# Patient Record
Sex: Male | Born: 1945 | Race: White | Hispanic: No | Marital: Married | Smoking: Former smoker
Health system: Southern US, Community
[De-identification: ages and names within clinical notes are randomized; demographics above are authoritative.]

## PROBLEM LIST (undated history)

## (undated) DIAGNOSIS — I2510 Atherosclerotic heart disease of native coronary artery without angina pectoris: Secondary | ICD-10-CM

## (undated) DIAGNOSIS — M353 Polymyalgia rheumatica: Secondary | ICD-10-CM

## (undated) DIAGNOSIS — O2230 Deep phlebothrombosis in pregnancy, unspecified trimester: Secondary | ICD-10-CM

---

## 2019-09-25 ENCOUNTER — Ambulatory Visit: Payer: Self-pay

## 2019-10-03 ENCOUNTER — Ambulatory Visit: Payer: Self-pay

## 2019-10-16 ENCOUNTER — Ambulatory Visit: Payer: Self-pay

## 2020-07-19 ENCOUNTER — Other Ambulatory Visit: Payer: Self-pay

## 2020-07-19 ENCOUNTER — Encounter (HOSPITAL_BASED_OUTPATIENT_CLINIC_OR_DEPARTMENT_OTHER): Payer: Self-pay

## 2020-07-19 ENCOUNTER — Emergency Department (HOSPITAL_BASED_OUTPATIENT_CLINIC_OR_DEPARTMENT_OTHER)
Admission: EM | Admit: 2020-07-19 | Discharge: 2020-07-19 | Disposition: A | Discharge status: Home / Self Care | Payer: 59 | Attending: Emergency Medicine | Admitting: Emergency Medicine

## 2020-07-19 ENCOUNTER — Emergency Department (HOSPITAL_BASED_OUTPATIENT_CLINIC_OR_DEPARTMENT_OTHER): Payer: 59

## 2020-07-19 DIAGNOSIS — I82401 Acute embolism and thrombosis of unspecified deep veins of right lower extremity: Secondary | ICD-10-CM | POA: Insufficient documentation

## 2020-07-19 DIAGNOSIS — Z87891 Personal history of nicotine dependence: Secondary | ICD-10-CM | POA: Diagnosis not present

## 2020-07-19 DIAGNOSIS — M79661 Pain in right lower leg: Secondary | ICD-10-CM | POA: Diagnosis present

## 2020-07-19 DIAGNOSIS — Z7901 Long term (current) use of anticoagulants: Secondary | ICD-10-CM | POA: Insufficient documentation

## 2020-07-19 DIAGNOSIS — I82451 Acute embolism and thrombosis of right peroneal vein: Secondary | ICD-10-CM

## 2020-07-19 HISTORY — DX: Polymyalgia rheumatica: M35.3

## 2020-07-19 MED ORDER — APIXABAN (ELIQUIS) VTE STARTER PACK (10MG AND 5MG): ORAL_TABLET | ORAL | 0 refills | Status: AC

## 2020-07-19 NOTE — ED Provider Notes (Signed)
[facility] EMERGENCY DEPARTMENT Provider Note   CSN: [id number] Arrival date & time: 07/19/20  0254     History Chief Complaint  Patient presents with  . Leg Pain    Cesar Webb is a 74 y.o. male.  HPI Pain and swelling in the right lower leg over the last 2 days.  Woke with it Saturday morning.  States leg is more swollen.  No fevers.  He is on Effient for previous heart stents.  No trauma.  States there is more bruising.  States it hurts to walk on.  Hurts a lot to touch just the back of his calf.  Think something could have bit him but not see anything.  He is also on prednisone for polymyalgia rheumatica has not had clots in his leg previously.  No chest pain or trouble breathing.  States his heart rate was 60 and sats of 97 at home.    Past Medical History:  Diagnosis Date  . PMR (polymyalgia rheumatica) (HCC)     There are no problems to display for this patient.   History reviewed. No pertinent surgical history.     History reviewed. No pertinent family history.  Social History   Tobacco Use  . Smoking status: Former Smoker  Substance Use Topics  . Alcohol use: Yes    Alcohol/week: 7.0 standard drinks    Types: 7 Shots of liquor per week  . Drug use: Never    Home Medications Prior to Admission medications   Medication Sig Start Date End Date Taking? Authorizing Provider  prasugrel (EFFIENT) 10 MG TABS tablet Take 10 mg by mouth daily.   Yes [provider]  predniSONE (DELTASONE) 1 MG tablet Take 3 mg by mouth daily with breakfast.   Yes [provider]  APIXABAN (ELIQUIS) VTE STARTER PACK (10MG  AND 5MG ) Take as directed on package: start with two-5mg  tablets twice daily for 7 days. On day 8, switch to one-5mg  tablet twice daily. 07/19/20   , MD    Allergies    Patient has no known allergies.  Review of Systems   Review of Systems  Constitutional: Negative for appetite change.  HENT: Negative for  congestion.   Respiratory: Negative for shortness of breath.   Cardiovascular: Positive for leg swelling. Negative for palpitations.  Gastrointestinal: Negative for abdominal distention.  Genitourinary: Negative for flank pain.  Musculoskeletal: Negative for back pain.  Skin: Positive for wound.  Neurological: Negative for weakness.  Hematological: Bruises/bleeds easily.  Psychiatric/Behavioral: Negative for confusion.    Physical Exam Updated Vital Signs BP 139/79 (BP Location: Right Arm)   Pulse (!) 58   Temp 98.1 F (36.7 C) (Oral)   Resp 16   Ht 6' (1.829 m)   Wt 88.5 kg   SpO2 100%   BMI 26.45 kg/m   Physical Exam Vitals and nursing note reviewed.  HENT:     Head: Atraumatic.  Pulmonary:     Breath sounds: No wheezing or rhonchi.  Abdominal:     Tenderness: There is no abdominal tenderness.  Musculoskeletal:        General: Tenderness present.     Cervical back: Neck supple.     Comments: Ecchymosis some erythema and edema to right lower leg.  Tenderness worse posteriorly.  Good active and passive flexion and extension at the ankle.  Pain with passive dorsiflexion at the ankle but not as much with active plantarflexion.  Dorsalis pedis pulse intact.  2 scabs on the  anterior mid tibia.  Skin:    General: Skin is warm.     Capillary Refill: Capillary refill takes less than 2 seconds.  Neurological:     Mental Status: He is alert and oriented to person, place, and time.         ED Results / Procedures / Treatments   Labs (all labs ordered are listed, but only abnormal results are displayed) Labs Reviewed - No data to display  EKG None  Radiology DG Tibia/Fibula Right  Result Date: 07/19/2020 CLINICAL DATA:  Spider bite potentially, RIGHT lower leg redness and swelling edema EXAM: RIGHT TIBIA AND FIBULA - 2 VIEW COMPARISON:  November 22, 2017 FINDINGS: Lower extremity edema. Soft tissues otherwise unremarkable with degenerative changes about the RIGHT ankle.  No acute fracture or dislocation. Vascular calcification posterior to the knee. A IMPRESSION: No acute fracture or suspicious bone finding. Soft tissue swelling about the RIGHT lower extremity, generalized without radiopaque foreign body. Electronically Signed   By: Donzetta Kohut M.D.   On: 07/19/2020 09:05   US Venous Img Lower Unilateral Right  Result Date: 07/19/2020 CLINICAL DATA:  74 year old male with right lower extremity edema. EXAM: RIGHT LOWER EXTREMITY VENOUS DOPPLER ULTRASOUND TECHNIQUE: Gray-scale sonography with graded compression, as well as color Doppler and duplex ultrasound were performed to evaluate the right lower extremity deep venous systems from the level of the common femoral vein and including the common femoral, femoral, profunda femoral, popliteal and calf veins including the posterior tibial, peroneal and gastrocnemius veins when visible. Spectral Doppler was utilized to evaluate flow at rest and with distal augmentation maneuvers in the common femoral, femoral and popliteal veins. The contralateral common femoral vein was also evaluated for comparison. COMPARISON:  None. FINDINGS: RIGHT LOWER EXTREMITY Common Femoral Vein: No evidence of thrombus. Normal compressibility, respiratory phasicity and response to augmentation. Central Greater Saphenous Vein: No evidence of thrombus. Normal compressibility and flow on color Doppler imaging. Central Profunda Femoral Vein: No evidence of thrombus. Normal compressibility and flow on color Doppler imaging. Femoral Vein: No evidence of thrombus. Normal compressibility, respiratory phasicity and response to augmentation. Popliteal Vein: No evidence of thrombus. Normal compressibility, respiratory phasicity and response to augmentation. Calf Veins: Acute appearing, noncompressible, mildly expansile and hypoechoic thrombus in the mid peroneal vein which appears occlusive. Posterior tibial vein is patent. Other Findings:  None. LEFT LOWER  EXTREMITY Common Femoral Vein: No evidence of thrombus. Normal compressibility, respiratory phasicity and response to augmentation. IMPRESSION: Acute, occlusive deep vein thrombosis limited to the mid right peroneal vein. These results will be called to the ordering clinician or representative by the Radiologist Assistant, and communication documented in the PACS or [employer]. Marliss Coots, MD [facility] Radiology Electronically Signed   By: Marliss Coots MD   On: 07/19/2020 08:53    Procedures Procedures (including critical care time)  Medications Ordered in ED Medications - No data to display  ED Course  I have reviewed the triage vital signs and the nursing notes.  Pertinent labs & imaging results that were available during my care of the patient were reviewed by me and considered in my medical decision making (see chart for details).    MDM Rules/Calculators/A&P                          Patient presents with pain and swelling in right lower leg.  Does not appear to have cellulitis.  However does have  peroneal DVT.  Symptomatic with redness and swelling.  Discussed with vascular surgery and pharmacy.  Will do anticoagulation since it is symptomatic.  Will follow up with cardiology and has an appointment already on December 2.  Will return for signs of pulmonary embolism.  Discharge home. Final Clinical Impression(s) / ED Diagnoses Final diagnoses:  Acute deep vein thrombosis (DVT) of right peroneal vein (HCC)    Rx / DC Orders ED Discharge Orders         Ordered    APIXABAN (ELIQUIS) VTE STARTER PACK (10MG  AND 5MG )        07/19/20 0946           , MD 07/19/20 1001

## 2020-07-19 NOTE — Discharge Instructions (Addendum)
Take the Eliquis along with your other heart medicines.  Watch for signs of bleeding and be very cautious if you hit your head or have other injuries.  Follow-up with your cardiologist as planned follow-up with your PCP or the vascular surgeon for further evaluation and treatment of the DVT.

## 2020-07-19 NOTE — ED Triage Notes (Addendum)
Pt reports pain and redness in R lower leg and thinks it was an insect bite. Pt states he thinks it happened Friday night while he was sleeping. PT c/o pain, redness and swelling. Bruising, redness, warmth and breaks in skin noted on R lower leg.

## 2020-08-14 ENCOUNTER — Other Ambulatory Visit: Payer: Self-pay

## 2020-08-14 ENCOUNTER — Encounter (HOSPITAL_BASED_OUTPATIENT_CLINIC_OR_DEPARTMENT_OTHER): Payer: Self-pay | Admitting: Emergency Medicine

## 2020-08-14 ENCOUNTER — Emergency Department (HOSPITAL_BASED_OUTPATIENT_CLINIC_OR_DEPARTMENT_OTHER)
Admission: EM | Admit: 2020-08-14 | Discharge: 2020-08-14 | Disposition: A | Discharge status: Home / Self Care | Payer: 59 | Attending: Emergency Medicine | Admitting: Emergency Medicine

## 2020-08-14 DIAGNOSIS — I2510 Atherosclerotic heart disease of native coronary artery without angina pectoris: Secondary | ICD-10-CM | POA: Insufficient documentation

## 2020-08-14 DIAGNOSIS — Z87891 Personal history of nicotine dependence: Secondary | ICD-10-CM | POA: Insufficient documentation

## 2020-08-14 DIAGNOSIS — H1132 Conjunctival hemorrhage, left eye: Secondary | ICD-10-CM | POA: Diagnosis not present

## 2020-08-14 DIAGNOSIS — H5789 Other specified disorders of eye and adnexa: Secondary | ICD-10-CM | POA: Diagnosis present

## 2020-08-14 DIAGNOSIS — Z7901 Long term (current) use of anticoagulants: Secondary | ICD-10-CM | POA: Diagnosis not present

## 2020-08-14 HISTORY — DX: Deep phlebothrombosis in pregnancy, unspecified trimester: O22.30

## 2020-08-14 HISTORY — DX: Atherosclerotic heart disease of native coronary artery without angina pectoris: I25.10

## 2020-08-14 NOTE — Discharge Instructions (Signed)
See your eye doctor for recheck next week

## 2020-08-14 NOTE — ED Triage Notes (Signed)
Swelling and bleeding noted to L eye. This started last night after taking his contacts out and blowing his nose. Pt takes Eliquis. States it feels like there is pressure in his eye and he has a "curtain" over it.

## 2020-08-14 NOTE — ED Provider Notes (Signed)
[facility] EMERGENCY DEPARTMENT Provider Note   CSN: [id number] Arrival date & time: 08/14/20  0915     History Chief Complaint  Patient presents with  . Eye Problem    Cesar Webb is a 74 y.o. male.  Pt complains of redness to eye.  Pt reports he sneezed and had bleeding.  Pt reports he has had happen in the past but not this bad.  Pt was started on eliquis recently.  Pt is on the once a day dosage.  Pt reports he has no vision changes.  Pt reports he has had some problems with bleeding from his nose and in to sinuses since starting eliquis   The history is provided by the patient. No language interpreter was used.  Eye Problem Location:  Left eye Quality:  Stinging Severity:  Moderate Onset quality:  Sudden Duration:  1 day Timing:  Constant Progression:  Worsening Chronicity:  New Ineffective treatments:  None tried Associated symptoms: no blurred vision and no decreased vision   Risk factors: conjunctival hemorrhage        Past Medical History:  Diagnosis Date  . Coronary artery disease   . DVT complicating pregnancy   . PMR (polymyalgia rheumatica) (HCC)     There are no problems to display for this patient.   History reviewed. No pertinent surgical history.     No family history on file.  Social History   Tobacco Use  . Smoking status: Former Games developer  . Smokeless tobacco: Never Used  Substance Use Topics  . Alcohol use: Yes    Alcohol/week: 7.0 standard drinks    Types: 7 Shots of liquor per week  . Drug use: Never    Home Medications Prior to Admission medications   Medication Sig Start Date End Date Taking? Authorizing Provider  APIXABAN (ELIQUIS) VTE STARTER PACK (10MG  AND 5MG ) Take as directed on package: start with two-5mg  tablets twice daily for 7 days. On day 8, switch to one-5mg  tablet twice daily. 07/19/20   , MD  prasugrel (EFFIENT) 10 MG TABS tablet Take 10 mg by mouth daily.    [provider]  predniSONE (DELTASONE) 1 MG tablet Take 3 mg by mouth daily with breakfast.    [provider]    Allergies    Patient has no known allergies.  Review of Systems   Review of Systems  Eyes: Negative for blurred vision.  All other systems reviewed and are negative.   Physical Exam Updated Vital Signs BP 137/79 (BP Location: Left Arm)   Pulse 65   Temp 99.3 F (37.4 C) (Oral)   Resp 16   Ht 6' (1.829 m)   Wt 88.5 kg   SpO2 98%   BMI 26.45 kg/m   Physical Exam Vitals and nursing note reviewed.  Constitutional:      Appearance: He is well-developed and well-nourished.  HENT:     Head: Normocephalic and atraumatic.  Eyes:     Extraocular Movements: Extraocular movements intact.     Pupils: Pupils are equal, round, and reactive to light.     Comments: conjunctival hemorrhage and corner of eyes.   Cardiovascular:     Rate and Rhythm: Normal rate and regular rhythm.     Heart sounds: No murmur heard.   Pulmonary:     Effort: Pulmonary effort is normal. No respiratory distress.     Breath sounds: Normal breath sounds.  Abdominal:     Palpations: Abdomen is soft.  Tenderness: There is no abdominal tenderness.  Musculoskeletal:        General: No edema.     Cervical back: Neck supple.  Skin:    General: Skin is warm and dry.  Neurological:     General: No focal deficit present.     Mental Status: He is alert.  Psychiatric:        Mood and Affect: Mood and affect and mood normal.     ED Results / Procedures / Treatments   Labs (all labs ordered are listed, but only abnormal results are displayed) Labs Reviewed - No data to display  EKG None  Radiology No results found.  Procedures Procedures (including critical care time)  Medications Ordered in ED Medications - No data to display  ED Course  I have reviewed the triage vital signs and the nursing notes.  Pertinent labs & imaging results that were available during my care  of the patient were reviewed by me and considered in my medical decision making (see chart for details).    MDM Rules/Calculators/A&P                          MDM:  Pt advised to follow up with his eye doctor for recheck next week.  Final Clinical Impression(s) / ED Diagnoses Final diagnoses:  Subconjunctival bleed, left    Rx / DC Orders ED Discharge Orders    None    An After Visit Summary was printed and given to the patient.    Elson Areas, [place] 08/14/20 1027    Mesner, Barbara Cower, MD 08/14/20 1210

## 2022-02-11 ENCOUNTER — Encounter (HOSPITAL_BASED_OUTPATIENT_CLINIC_OR_DEPARTMENT_OTHER): Payer: Self-pay | Admitting: Emergency Medicine

## 2022-02-11 ENCOUNTER — Emergency Department (HOSPITAL_BASED_OUTPATIENT_CLINIC_OR_DEPARTMENT_OTHER)
Admission: EM | Admit: 2022-02-11 | Discharge: 2022-02-11 | Disposition: A | Discharge status: Home / Self Care | Payer: Medicare Other | Attending: Emergency Medicine | Admitting: Emergency Medicine

## 2022-02-11 ENCOUNTER — Other Ambulatory Visit: Payer: Self-pay

## 2022-02-11 DIAGNOSIS — L089 Local infection of the skin and subcutaneous tissue, unspecified: Secondary | ICD-10-CM | POA: Insufficient documentation

## 2022-02-11 DIAGNOSIS — Z7901 Long term (current) use of anticoagulants: Secondary | ICD-10-CM | POA: Diagnosis not present

## 2022-02-11 DIAGNOSIS — S81812A Laceration without foreign body, left lower leg, initial encounter: Secondary | ICD-10-CM | POA: Diagnosis not present

## 2022-02-11 DIAGNOSIS — S8992XA Unspecified injury of left lower leg, initial encounter: Secondary | ICD-10-CM | POA: Diagnosis present

## 2022-02-11 DIAGNOSIS — X58XXXA Exposure to other specified factors, initial encounter: Secondary | ICD-10-CM | POA: Diagnosis not present

## 2022-02-11 MED ORDER — DOXYCYCLINE HYCLATE 100 MG PO CAPS: 100.0000 mg | ORAL_CAPSULE | Freq: Two times a day (BID) | ORAL | 0 refills | Status: AC

## 2022-02-11 NOTE — ED Provider Notes (Signed)
[facility] EMERGENCY DEPARTMENT Provider Note   CSN: [id number] Arrival date & time: 02/11/22  1308     History  Chief Complaint  Patient presents with   Extremity Laceration    Left Lower Leg    Cesar Webb is a 76 y.o. male who presents to the ED for evaluation of a left lower extremity skin tear that occurred last week in which he is concerned of developing infection.  Patient states there is a metal section sticking out from his nightstand which is why he accidentally lacerated his skin with 1 walking past.  He notes that he is on Eliquis and long-term, daily steroid use for PMR.  He notes that he is prone to poor healing wounds.  He has been cleaning the wound daily and using over the last couple of days, he noticed increased tenderness, redness and drainage from around the wound and came in for evaluation.  He denies fevers, chills and all other systemic complaints.  HPI     Home Medications Prior to Admission medications   Medication Sig Start Date End Date Taking? Authorizing Provider  APIXABAN Everlene Balls) VTE STARTER PACK (10MG  AND 5MG ) Take as directed on package: start with two-5mg  tablets twice daily for 7 days. On day 8, switch to one-5mg  tablet twice daily. 07/19/20  Yes , MD  doxycycline (VIBRAMYCIN) 100 MG capsule Take 1 capsule (100 mg total) by mouth 2 (two) times daily. 02/11/22  Yes Benjiman Core R, PA-C  prasugrel (EFFIENT) 10 MG TABS tablet Take 10 mg by mouth daily.   Yes [provider]  predniSONE (DELTASONE) 1 MG tablet Take 3 mg by mouth daily with breakfast.   Yes [provider]      Allergies    Patient has no known allergies.    Review of Systems   Review of Systems  Physical Exam Updated Vital Signs BP (!) 145/74 (BP Location: Left Arm)   Pulse 72   Temp 98.2 F (36.8 C) (Oral)   Resp 16   SpO2 97%  Physical Exam Vitals and nursing note reviewed.  Constitutional:      General: He is not  in acute distress.    Appearance: He is not ill-appearing.  HENT:     Head: Atraumatic.  Eyes:     Conjunctiva/sclera: Conjunctivae normal.  Cardiovascular:     Rate and Rhythm: Normal rate and regular rhythm.     Pulses: Normal pulses.     Heart sounds: No murmur heard. Pulmonary:     Effort: Pulmonary effort is normal. No respiratory distress.     Breath sounds: Normal breath sounds.  Abdominal:     General: Abdomen is flat. There is no distension.     Palpations: Abdomen is soft.     Tenderness: There is no abdominal tenderness.  Musculoskeletal:        General: Normal range of motion.     Cervical back: Normal range of motion.  Skin:    General: Skin is warm and dry.     Capillary Refill: Capillary refill takes less than 2 seconds.     Comments: Skin tear with mild purulent drainage and surrounding erythema of the left lateral shin  Neurological:     General: No focal deficit present.     Mental Status: He is alert.  Psychiatric:        Mood and Affect: Mood normal.      ED Results / Procedures / Treatments   Labs (  all labs ordered are listed, but only abnormal results are displayed) Labs Reviewed - No data to display  EKG None  Radiology No results found.  Procedures Procedures    Medications Ordered in ED Medications - No data to display  ED Course/ Medical Decision Making/ A&P                           Medical Decision Making Risk Prescription drug management.   76 year old male presents to the ED for evaluation of a suspected infection of a leg laceration that occurred last week.  Vitals without significant abnormality.  There is a skin tear with surrounding erythema and some purulent drainage of his left lower extremity.  No redness tracking up or down the leg.  Otherwise neurovascularly intact.  Patient has no systemic symptoms.  Discussed at home wound care and instructed to avoid Neosporin or Aquaphor ointment in order to allow scab formation.   Discharged home with doxycycline.  Return precautions discussed.  Patient expresses understanding is amenable to plan.  Discharged home in good condition. Final Clinical Impression(s) / ED Diagnoses Final diagnoses:  Wound infection    Rx / DC Orders ED Discharge Orders          Ordered    doxycycline (VIBRAMYCIN) 100 MG capsule  2 times daily        02/11/22 1453              Janell Quiet, [place] 02/11/22 1502    Alvira Monday, MD 02/12/22 2055

## 2022-02-11 NOTE — ED Notes (Signed)
Patient wound is pink and moist with yellow discharge . Wound is located on Left lower leg

## 2022-02-11 NOTE — Discharge Instructions (Addendum)
I sent you an antibiotic to your pharmacy.  Please take this for the duration prescribed, even if symptoms seem to be improving.  Change your bandage twice daily.  You can clean it with just soap and water, pat dry and then reapply a nonadherent dressing.  I would avoid using ointment in order to allow the wound to scab over.  Return if you have worsening symptoms or develop fevers.

## 2022-02-11 NOTE — ED Triage Notes (Signed)
Pt reports cutting his left lateral calf on a night stand last week. Area is now swollen, warm to touch, and red. + drainage. + blood thinners. Bleeding controlled.

## 2022-03-29 IMAGING — CR DG TIBIA/FIBULA 2V*R*
4 series · 4 of 4 positions shown · non-contrast
Comparison: November 22, 2017

CLINICAL DATA: Spider bite potentially, RIGHT lower leg redness and
swelling edema

EXAM:
RIGHT TIBIA AND FIBULA - 2 VIEW

[t tib/fib ap right (1 of 2)]
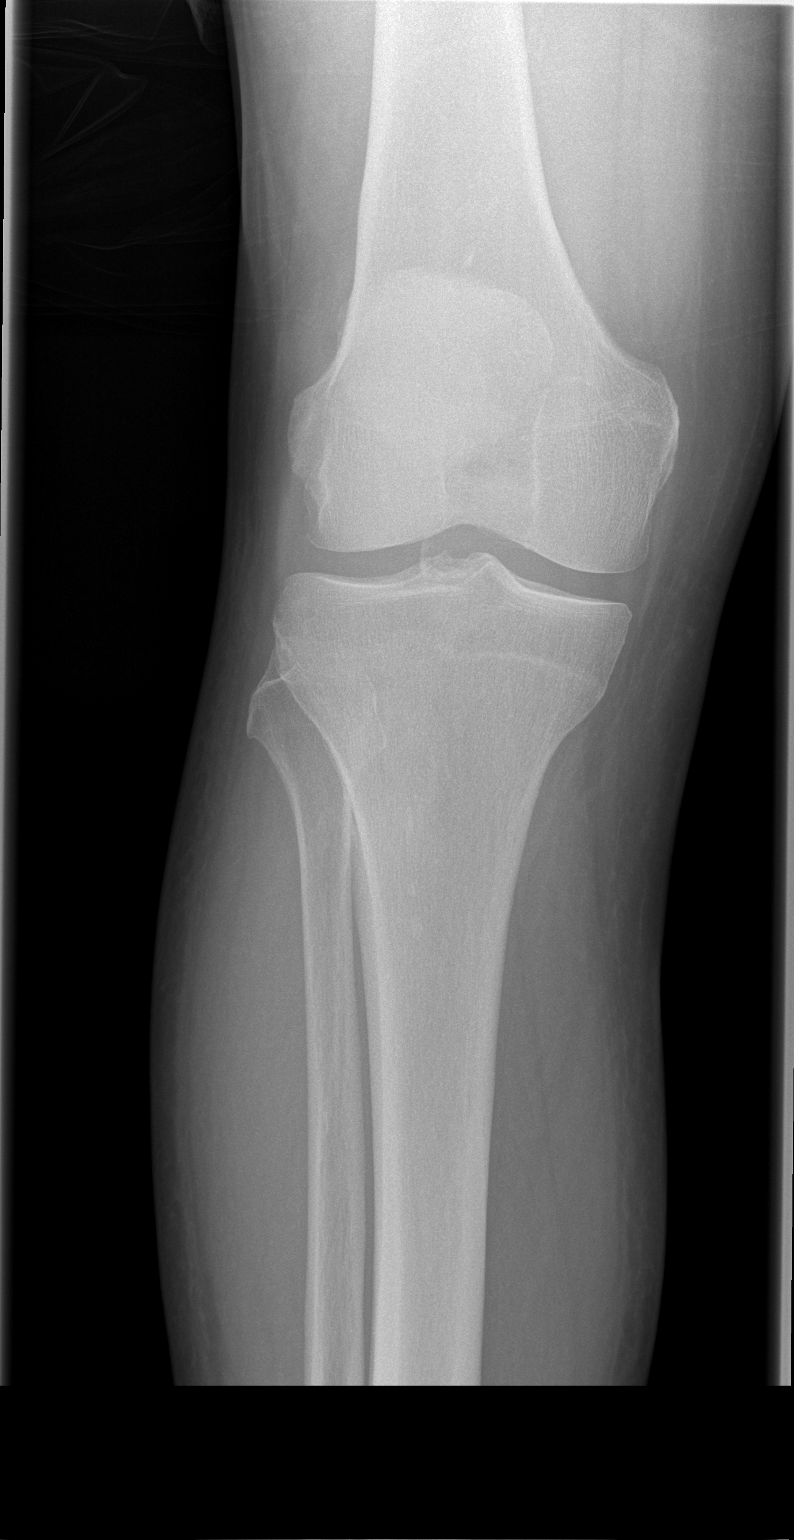

[t tib/fib ap right (2 of 2)]
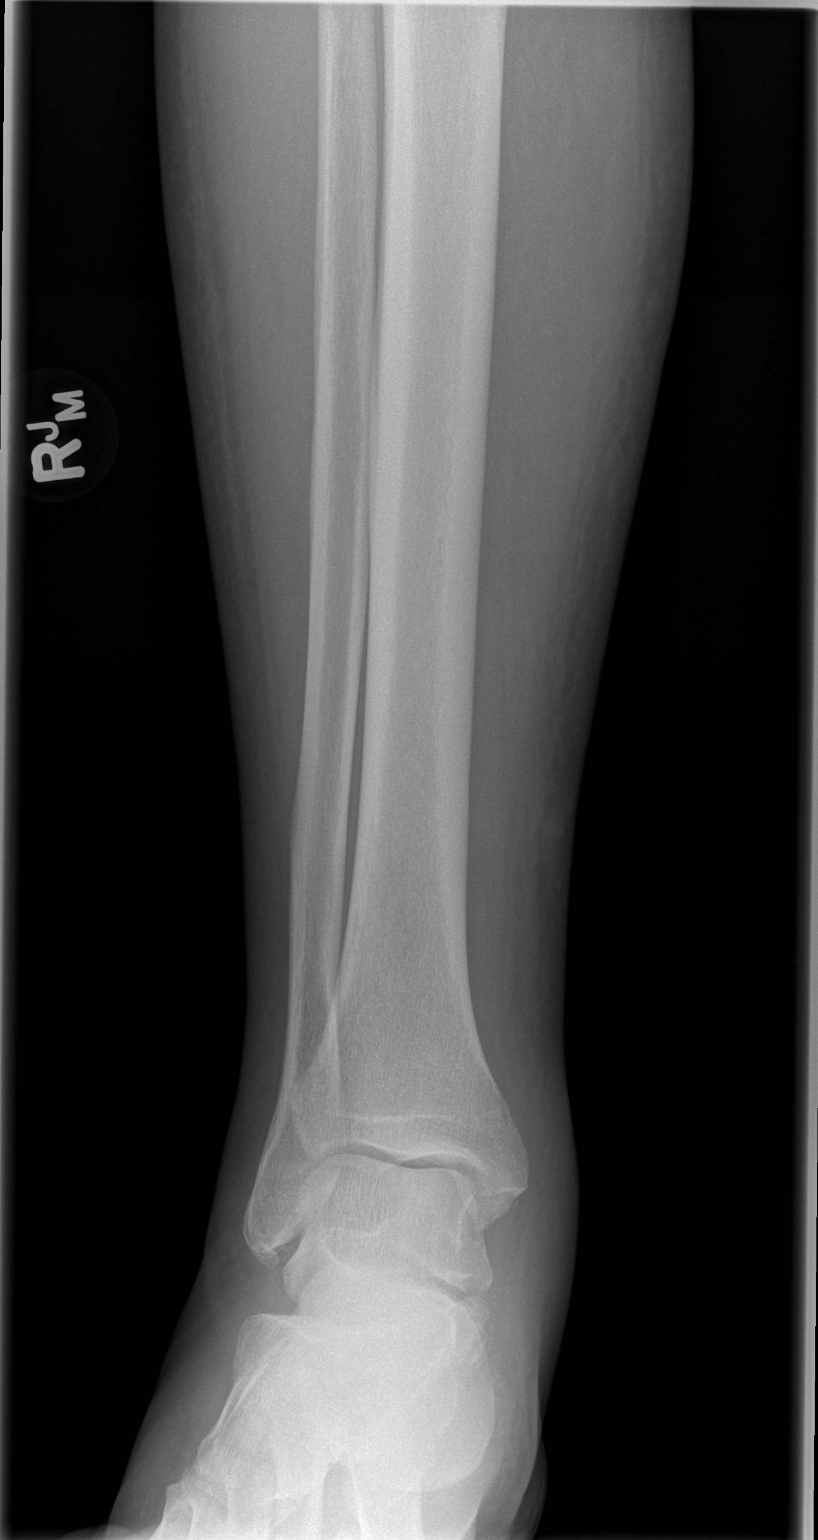

[t tib/fib lat right (1 of 2)]
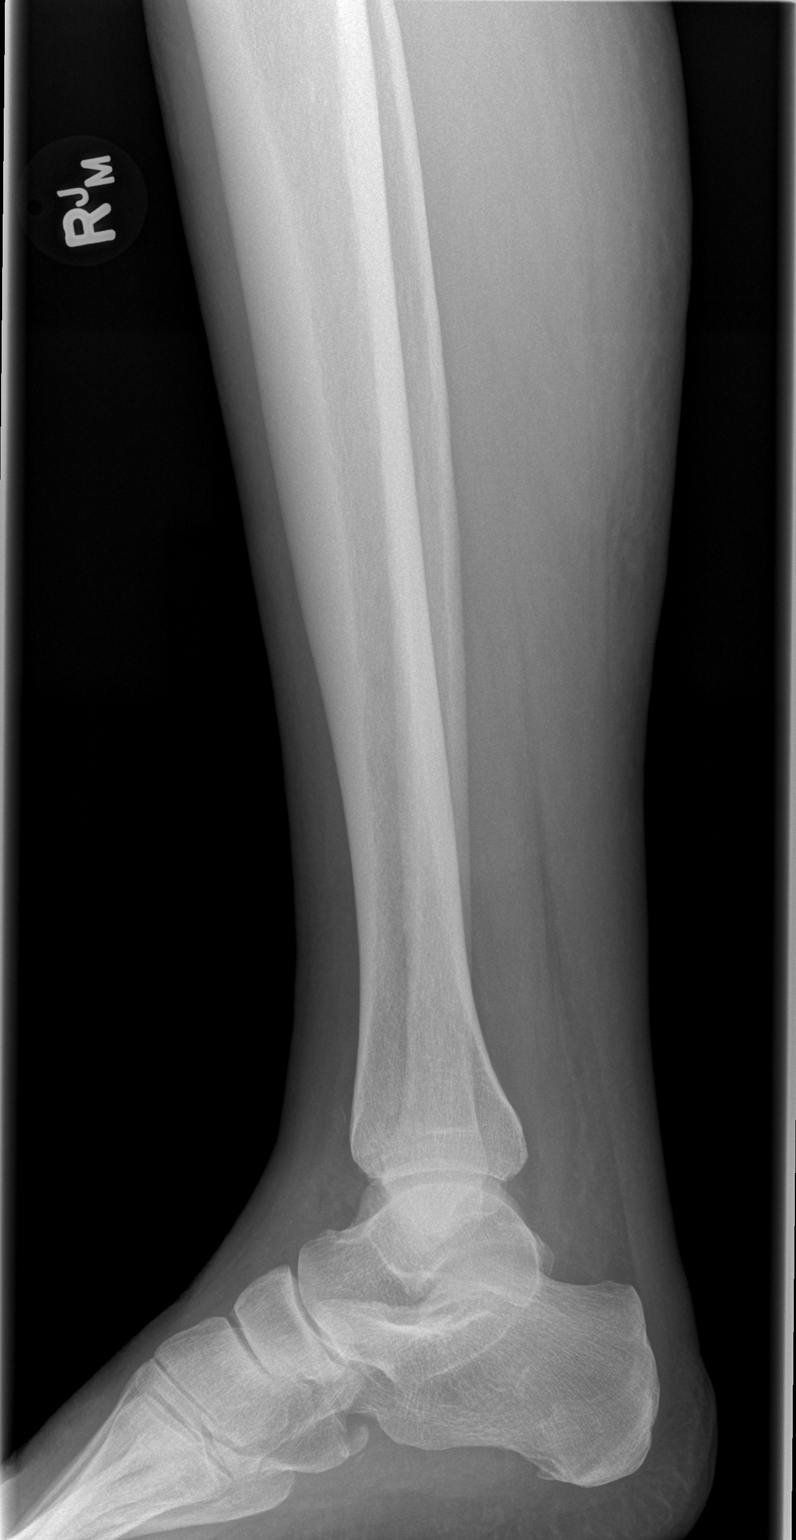

[t tib/fib lat right (2 of 2)]
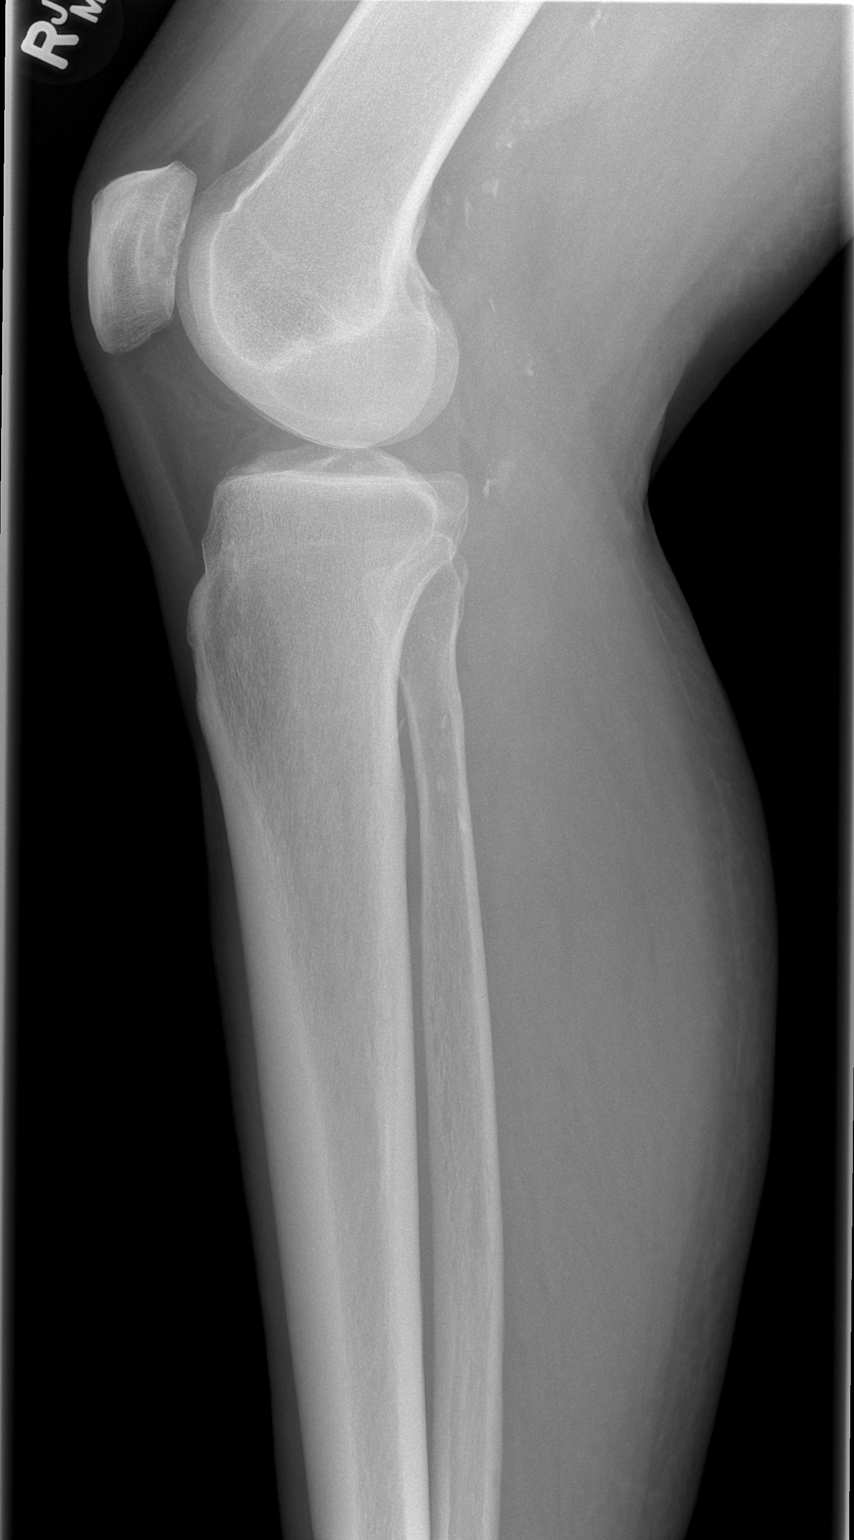

[4 of 4 positions shown; findings below may reference images not displayed]

FINDINGS: Lower extremity edema. Soft tissues otherwise unremarkable with
degenerative changes about the RIGHT ankle. No acute fracture or
dislocation. Vascular calcification posterior to the knee. A
IMPRESSION: No acute fracture or suspicious bone finding.

Soft tissue swelling about the RIGHT lower extremity, generalized
without radiopaque foreign body.

## 2022-03-29 IMAGING — US US EXTREM LOW VENOUS*R*
1 series · 13 of 24 positions shown · non-contrast
Comparison: None.

CLINICAL DATA: 74-year-old male with right lower extremity edema.

EXAM:
RIGHT LOWER EXTREMITY VENOUS DOPPLER ULTRASOUND
TECHNIQUE: Gray-scale sonography with graded compression, as well as color
Doppler and duplex ultrasound were performed to evaluate the right
lower extremity deep venous systems from the level of the common
femoral vein and including the common femoral, femoral, profunda
femoral, popliteal and calf veins including the posterior tibial,
peroneal and gastrocnemius veins when visible. Spectral Doppler was
utilized to evaluate flow at rest and with distal augmentation
maneuvers in the common femoral, femoral and popliteal veins. The
contralateral common femoral vein was also evaluated for comparison.

[Series 1: us extrem low venous*right* · 35 acquisitions, 13 frames shown]
[im 1/35]
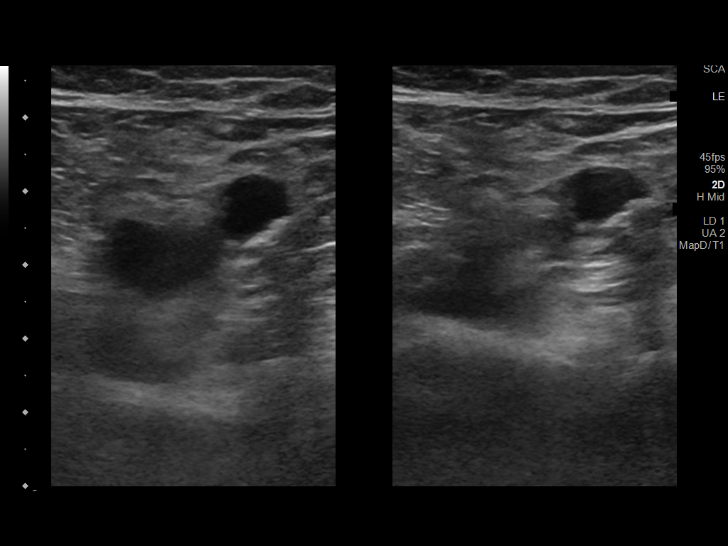
[im 3/35]
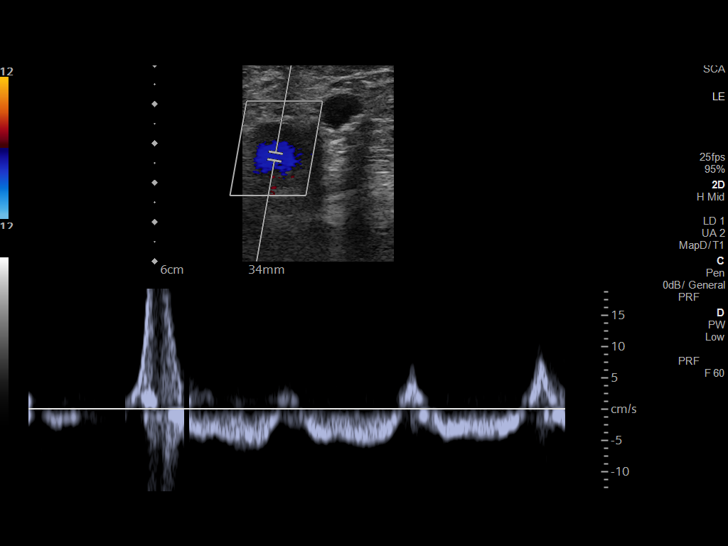
[im 6/35]
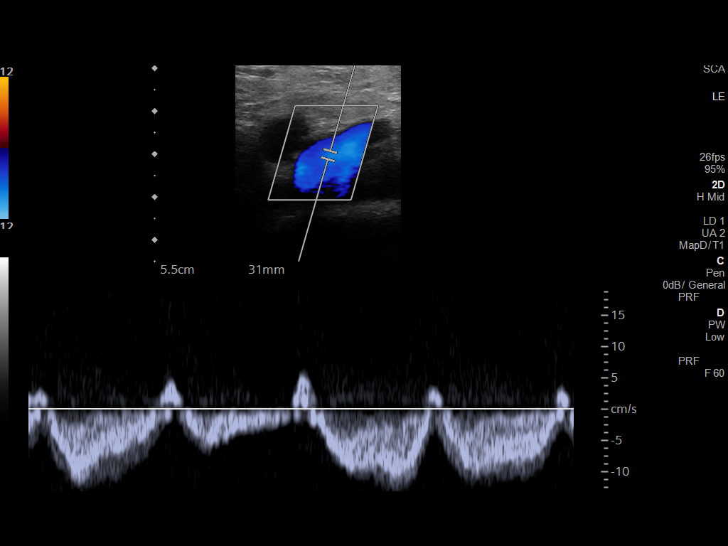
[im 9/35]
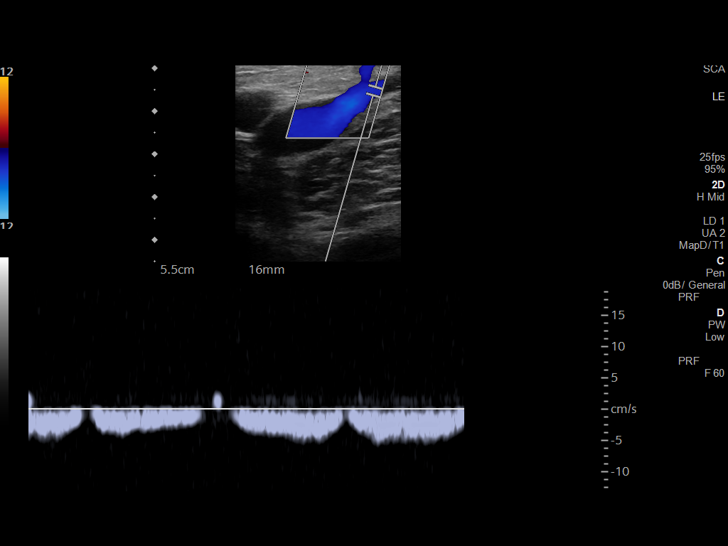
[im 12/35]
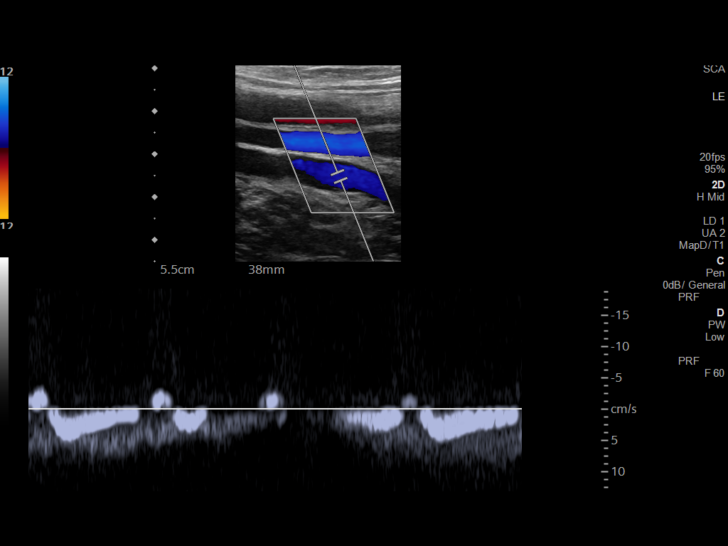
[im 15/35]
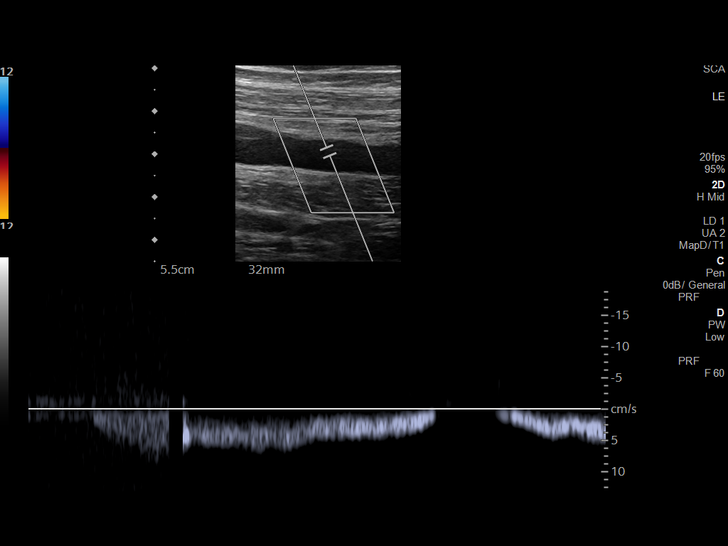
[im 20/35]
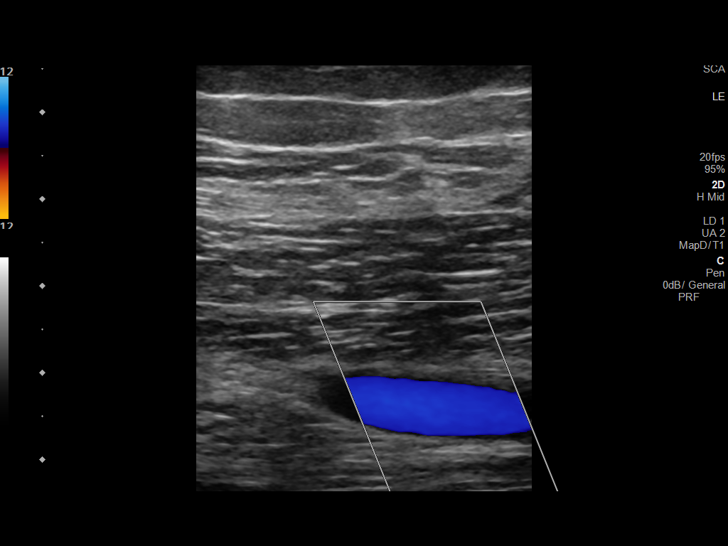
[im 21/35]
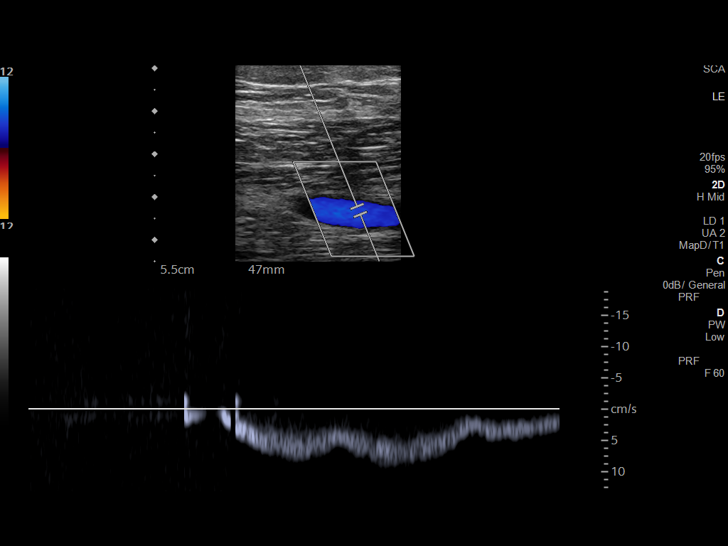
[im 24/35]
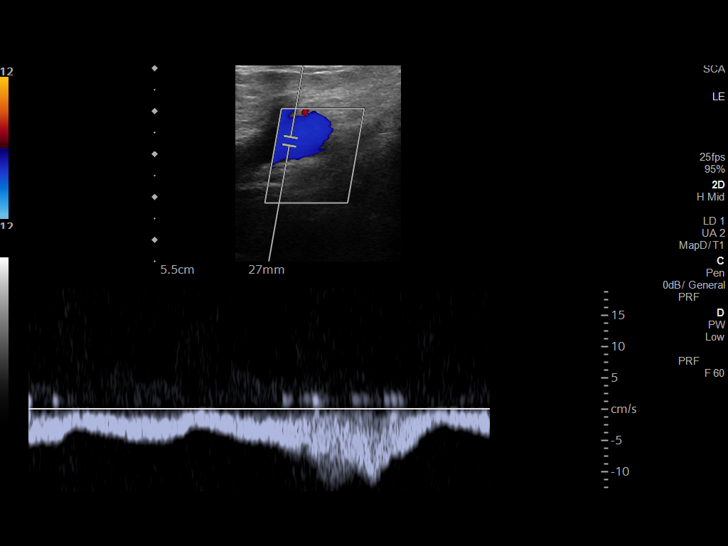
[im 27/35]
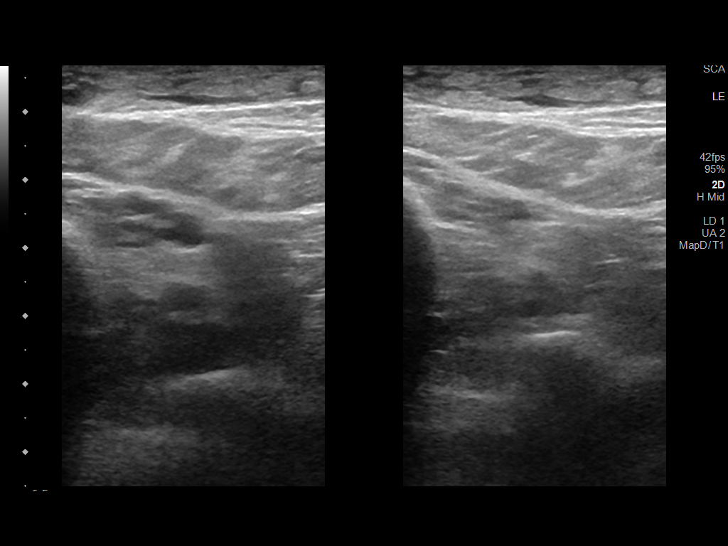
[im 30/35]
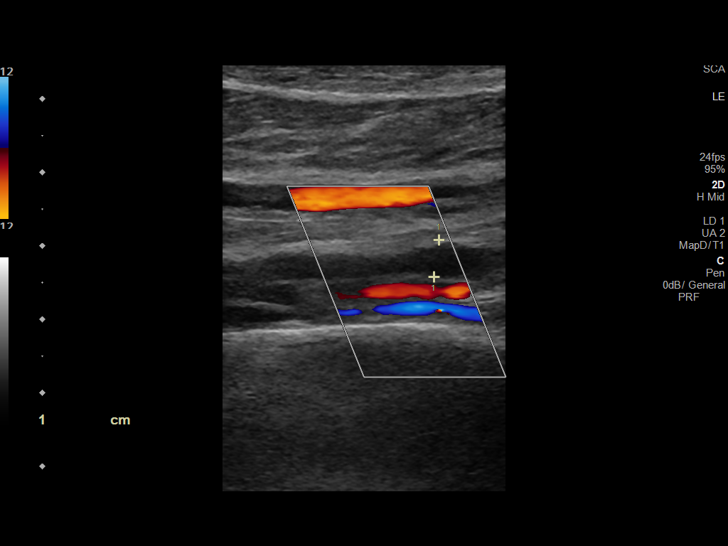
[im 32/35]
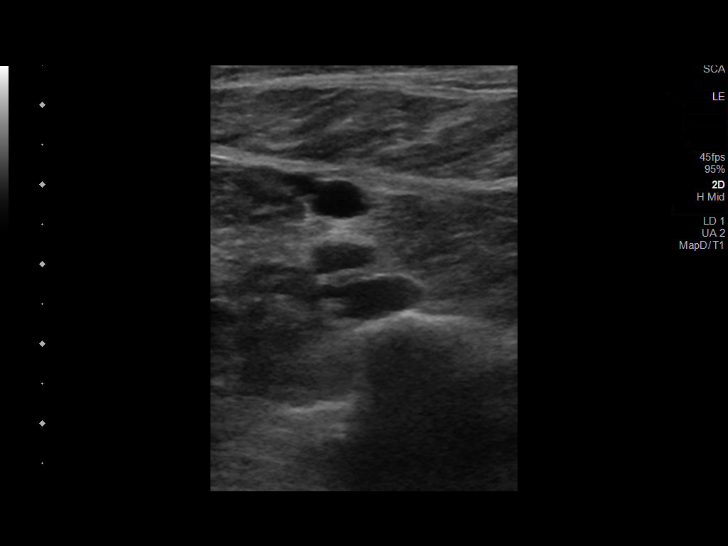
[im 35/35]
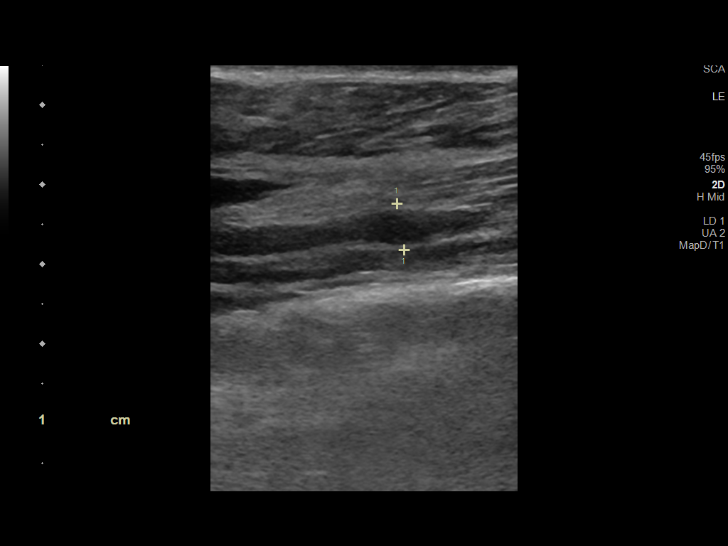

[13 of 24 positions shown; findings below may reference images not displayed]

FINDINGS: RIGHT LOWER EXTREMITY

Common Femoral Vein: No evidence of thrombus. Normal
compressibility, respiratory phasicity and response to augmentation.

Central Greater Saphenous Vein: No evidence of thrombus. Normal
compressibility and flow on color Doppler imaging.

Central Profunda Femoral Vein: No evidence of thrombus. Normal
compressibility and flow on color Doppler imaging.

Femoral Vein: No evidence of thrombus. Normal compressibility,
respiratory phasicity and response to augmentation.

Popliteal Vein: No evidence of thrombus. Normal compressibility,
respiratory phasicity and response to augmentation.

Calf Veins: Acute appearing, noncompressible, mildly expansile and
hypoechoic thrombus in the mid peroneal vein which appears
occlusive. Posterior tibial vein is patent.

Other Findings:  None.

LEFT LOWER EXTREMITY

Common Femoral Vein: No evidence of thrombus. Normal
compressibility, respiratory phasicity and response to augmentation.
IMPRESSION: Acute, occlusive deep vein thrombosis limited to the mid right
peroneal vein.

These results will be called to the ordering clinician or
representative by the Radiologist Assistant, and communication
documented in the PACS or [REDACTED].

## 2023-02-28 ENCOUNTER — Telehealth (HOSPITAL_BASED_OUTPATIENT_CLINIC_OR_DEPARTMENT_OTHER): Payer: Self-pay
